# Patient Record
Sex: Female | Born: 1970 | Hispanic: No | Marital: Married
Health system: Southern US, Community
[De-identification: ages and names within clinical notes are randomized; demographics above are authoritative.]

---

## 2009-10-13 ENCOUNTER — Ambulatory Visit: Payer: Self-pay | Admitting: Family Medicine

## 2009-10-19 ENCOUNTER — Ambulatory Visit: Payer: Self-pay | Admitting: Family Medicine

## 2011-02-22 ENCOUNTER — Ambulatory Visit: Payer: Self-pay | Admitting: Family Medicine

## 2014-07-06 ENCOUNTER — Ambulatory Visit: Payer: Self-pay | Admitting: Internal Medicine

## 2014-07-10 ENCOUNTER — Ambulatory Visit: Payer: Self-pay

## 2015-07-19 ENCOUNTER — Ambulatory Visit: Payer: Self-pay

## 2015-09-24 IMAGING — MG MAM DGTL ADD VW LT SCR
3 series · 3 of 3 positions shown · non-contrast
Comparison: 07/06/2014, 02/22/2011, 10/19/2009, 10/13/2009

CLINICAL DATA: 43-year-old female, callback from screening
mammogram for possible left breast asymmetry

EXAM:
DIGITAL DIAGNOSTIC LEFT MAMMOGRAM WITH CAD
ULTRASOUND LEFT BREAST

[L MLO (1 of 2)]
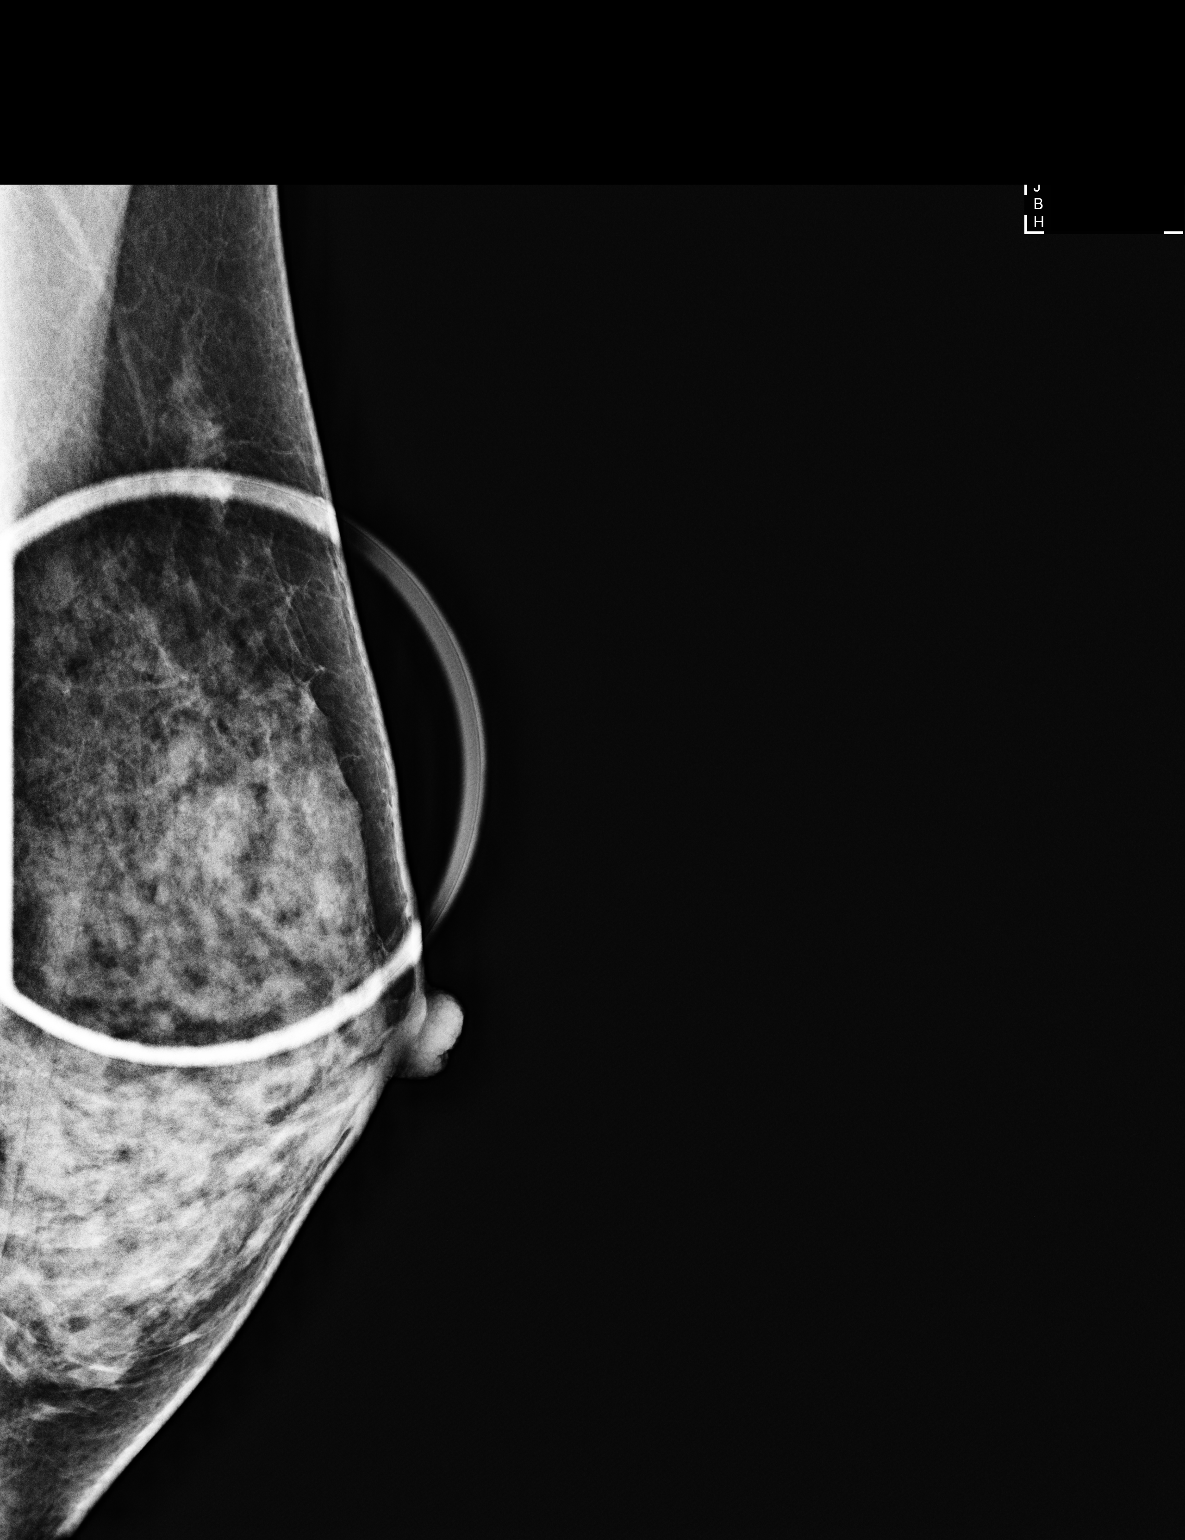

[L ML]
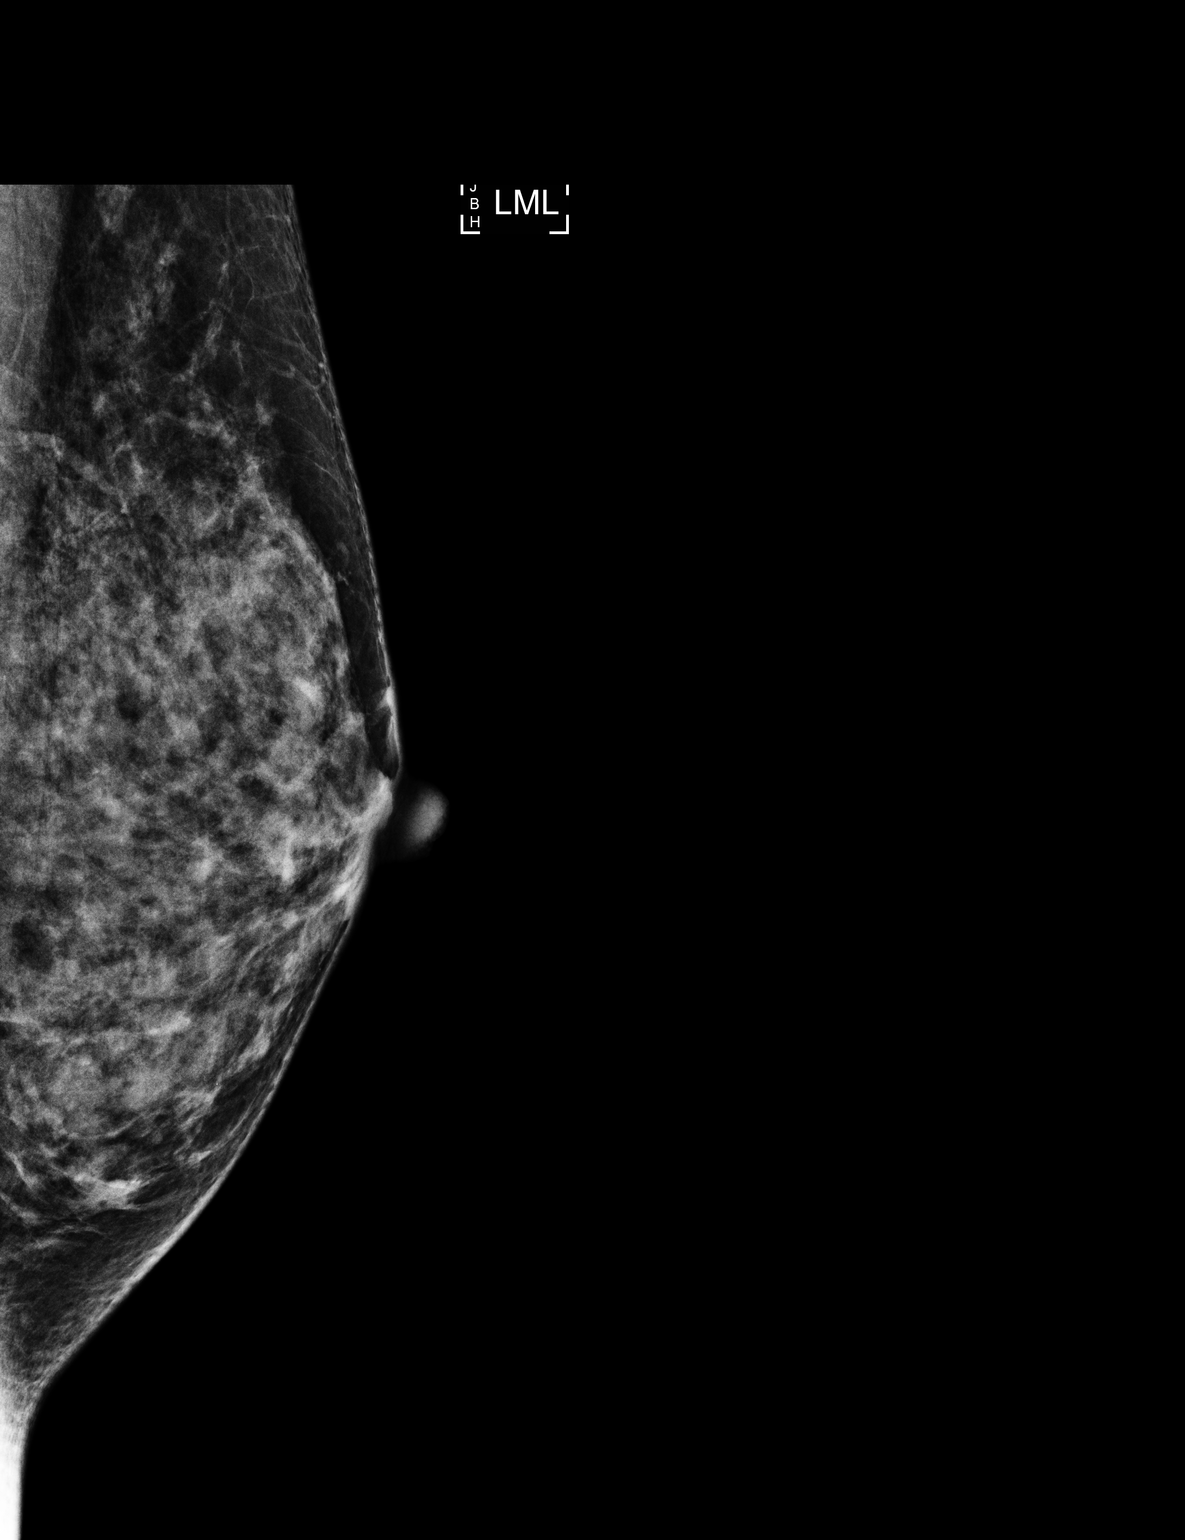

[L MLO (2 of 2)]
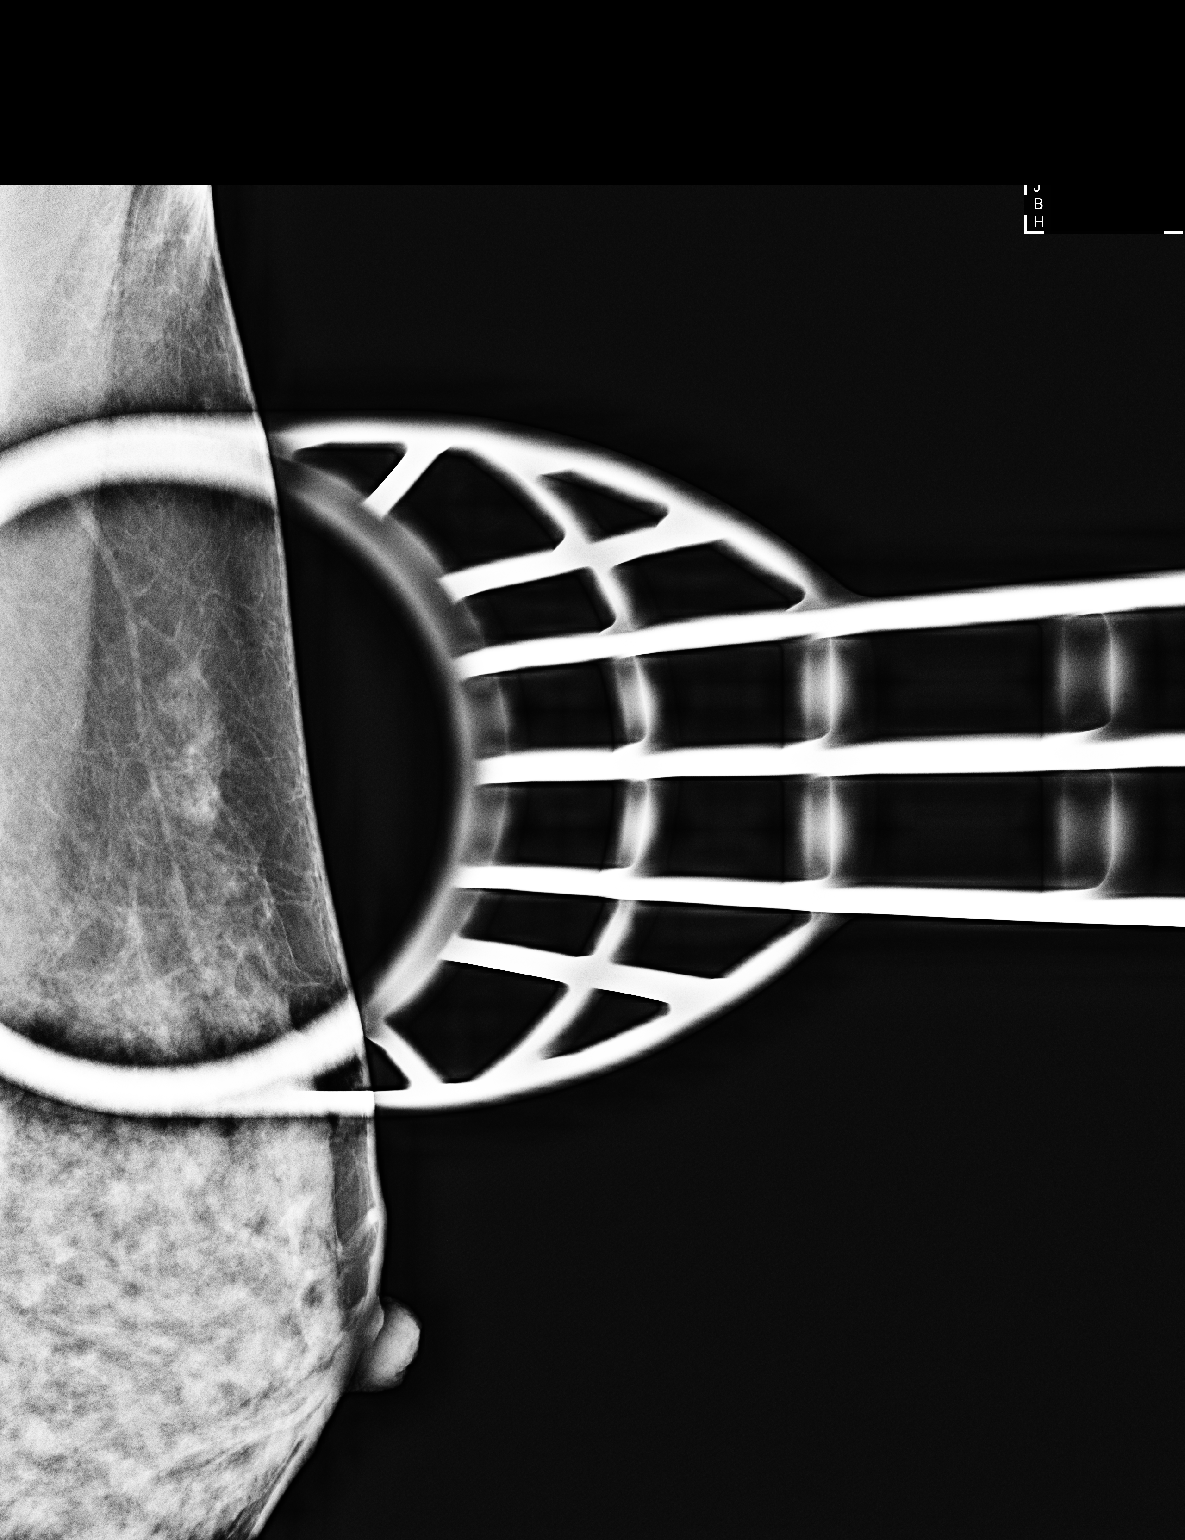

[3 of 3 positions shown; findings below may reference images not displayed]

ACR Breast Density Category d: The breast tissue is extremely dense,
which lowers the sensitivity of mammography.
FINDINGS: MLO spot-compression and full lateral views were performed. No
suspicious mass, calcifications, or other abnormality is seen on the
additional views in the area of concern. This area appears similar
to the previous study in 9409.

Mammographic images were processed with CAD.

On physical exam, no discrete mass is felt within the superior left
breast in the area of concern.

Targeted ultrasound is performed, showing normal appearing breast
parenchyma. No suspicious cystic or solid sonographic finding is
identified.
IMPRESSION: No mammographic or sonographic evidence of malignancy.

RECOMMENDATION:
Screening mammogram in one year.(Code:[id number]-8-6A0)

I have discussed the findings and recommendations with the patient.
Results were also provided in writing at the conclusion of the
visit. If applicable, a reminder letter will be sent to the patient
regarding the next appointment.

BI-RADS CATEGORY  1: Negative.

## 2017-04-25 ENCOUNTER — Ambulatory Visit: Payer: Self-pay

## 2017-06-27 ENCOUNTER — Ambulatory Visit
Admission: RE | Admit: 2017-06-27 | Discharge: 2017-06-27 | Disposition: A | Discharge status: Home / Self Care | Payer: Self-pay | Source: Ambulatory Visit | Attending: Oncology | Admitting: Oncology

## 2017-06-27 ENCOUNTER — Ambulatory Visit: Payer: Self-pay | Attending: Oncology | Admitting: *Deleted

## 2017-06-27 ENCOUNTER — Other Ambulatory Visit: Payer: Self-pay

## 2017-06-27 ENCOUNTER — Encounter: Payer: Self-pay | Admitting: *Deleted

## 2017-06-27 VITALS — BP 102/64 | HR 67 | Temp 97.0°F | Ht 65.0 in | Wt 113.0 lb

## 2017-06-27 DIAGNOSIS — Z0000 Encounter for general adult medical examination without abnormal findings: Secondary | ICD-10-CM

## 2017-06-27 NOTE — Progress Notes (Signed)
Subjective:     Patient ID: Kimberly Morton, female   DOB: 02/24/1971, 47 y.o.   MRN: [id number]  HPI   Review of Systems     Objective:   Physical Exam  Pulmonary/Chest: Right breast exhibits no inverted nipple, no mass, no nipple discharge, no skin change and no tenderness. Left breast exhibits no inverted nipple, no mass, no nipple discharge, no skin change and no tenderness. Breasts are symmetrical.       Assessment:     47 year old Asian female [facility].  Clinical breast exam unremarkable.  Taught self breast awareness.  Last pap on 12/28/16 was negative without HPV co-testing.  Next pap due in 2021.  Patient has been screened for eligibility.  She does not have any insurance, Medicare or Medicaid.  She also meets financial eligibility.  Hand-out given on the Affordable Care Act.     Plan:     Screening mammogram ordered.  Will follow-up per [facility] protocol.

## 2017-06-27 NOTE — Patient Instructions (Signed)
Gave patient hand-out, Women Staying Healthy, Active and Well from [facility], with education on breast health, pap smears, heart and colon health. 

## 2017-06-28 ENCOUNTER — Encounter: Payer: Self-pay | Admitting: *Deleted

## 2017-06-28 NOTE — Progress Notes (Signed)
Letter mailed from the [facility] to inform patient of her normal mammogram results.  Patient is to follow-up with annual screening in one year.  HSIS to Christy. 

## 2018-09-18 ENCOUNTER — Ambulatory Visit: Payer: Self-pay
